# Patient Record
Sex: Female | Born: 1955 | Race: White | Hispanic: No | Marital: Married
Health system: Southern US, Community
[De-identification: ages and names within clinical notes are randomized; demographics above are authoritative.]

## PROBLEM LIST (undated history)

## (undated) DIAGNOSIS — E119 Type 2 diabetes mellitus without complications: Secondary | ICD-10-CM

## (undated) HISTORY — DX: Type 2 diabetes mellitus without complications: E11.9

---

## 1999-01-28 ENCOUNTER — Other Ambulatory Visit: Admission: RE | Admit: 1999-01-28 | Discharge: 1999-01-28 | Payer: Self-pay | Admitting: Gynecology

## 2000-03-21 ENCOUNTER — Other Ambulatory Visit: Admission: RE | Admit: 2000-03-21 | Discharge: 2000-03-21 | Payer: Self-pay | Admitting: *Deleted

## 2000-05-14 ENCOUNTER — Emergency Department (HOSPITAL_COMMUNITY): Admission: EM | Admit: 2000-05-14 | Discharge: 2000-05-14 | Payer: Self-pay | Admitting: Emergency Medicine

## 2000-05-14 ENCOUNTER — Encounter: Payer: Self-pay | Admitting: Emergency Medicine

## 2009-03-26 ENCOUNTER — Other Ambulatory Visit: Admission: RE | Admit: 2009-03-26 | Discharge: 2009-03-26 | Payer: Self-pay | Admitting: Family Medicine

## 2010-04-12 ENCOUNTER — Encounter: Admission: RE | Admit: 2010-04-12 | Discharge: 2010-04-12 | Payer: Self-pay | Admitting: Internal Medicine

## 2010-12-22 ENCOUNTER — Other Ambulatory Visit: Payer: Self-pay | Admitting: Family Medicine

## 2010-12-22 ENCOUNTER — Other Ambulatory Visit (HOSPITAL_COMMUNITY)
Admission: RE | Admit: 2010-12-22 | Discharge: 2010-12-22 | Disposition: A | Discharge status: Home / Self Care | Payer: BC Managed Care – PPO | Source: Ambulatory Visit | Attending: Family Medicine | Admitting: Family Medicine

## 2010-12-22 DIAGNOSIS — Z124 Encounter for screening for malignant neoplasm of cervix: Secondary | ICD-10-CM | POA: Insufficient documentation

## 2010-12-22 DIAGNOSIS — Z1159 Encounter for screening for other viral diseases: Secondary | ICD-10-CM | POA: Insufficient documentation

## 2012-01-02 ENCOUNTER — Other Ambulatory Visit: Payer: Self-pay | Admitting: Family Medicine

## 2012-01-02 DIAGNOSIS — E042 Nontoxic multinodular goiter: Secondary | ICD-10-CM

## 2012-01-05 ENCOUNTER — Ambulatory Visit
Admission: RE | Admit: 2012-01-05 | Discharge: 2012-01-05 | Disposition: A | Discharge status: Home / Self Care | Payer: BC Managed Care – PPO | Source: Ambulatory Visit | Attending: Family Medicine | Admitting: Family Medicine

## 2012-01-05 DIAGNOSIS — E042 Nontoxic multinodular goiter: Secondary | ICD-10-CM

## 2013-11-04 IMAGING — US US SOFT TISSUE HEAD/NECK
1 series · 14 of 25 positions shown · non-contrast
Comparison: Ultrasound of the thyroid of 04/12/2010

CLINICAL DATA: Follow up of thyroid nodule

THYROID ULTRASOUND
TECHNIQUE: Ultrasound examination of the thyroid gland and adjacent
soft tissues was performed.

[Series 1: us soft tissue head/neck · 0.08mm/px · 14 of 39 slices shown]
[im 1/39]
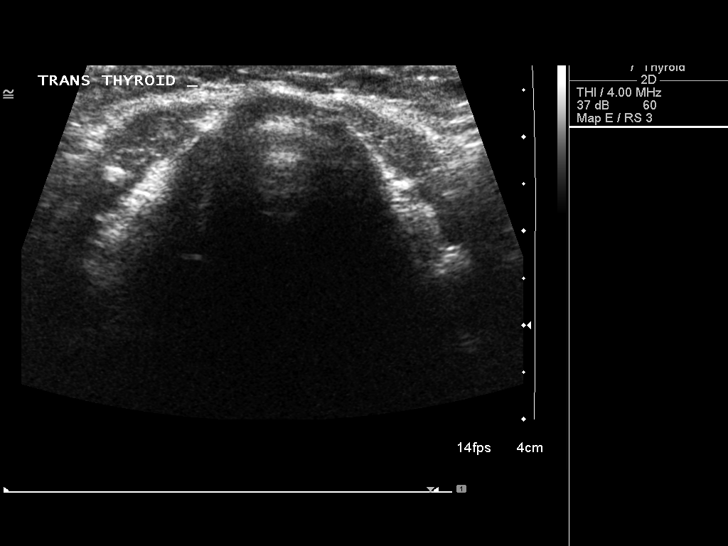
[im 4/39]
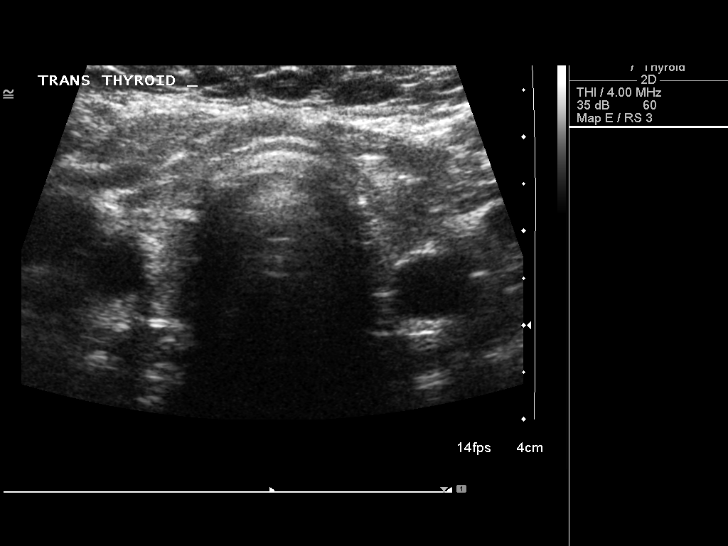
[im 7/39]
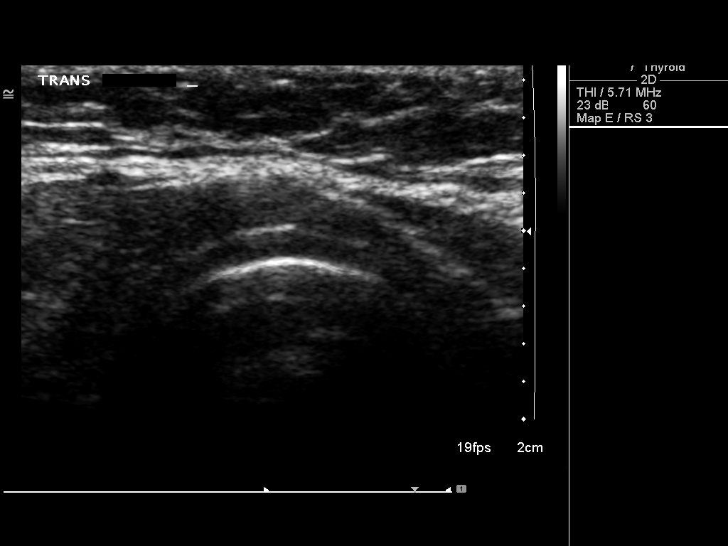
[im 10/39]
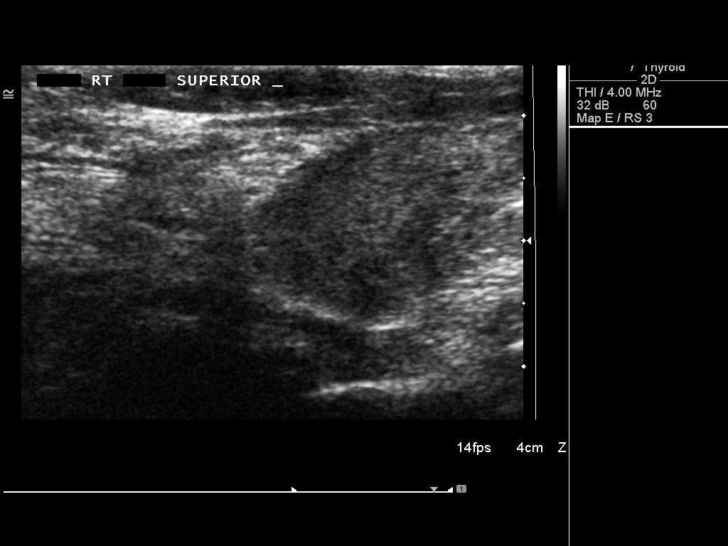
[im 13/39]
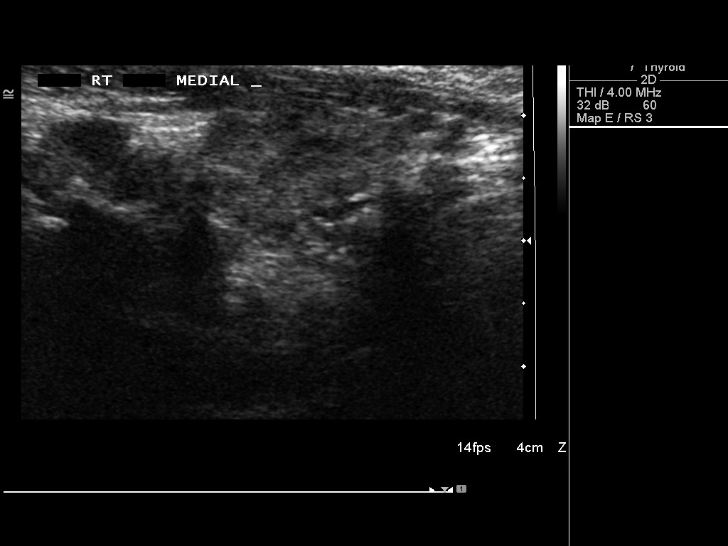
[im 15/39]
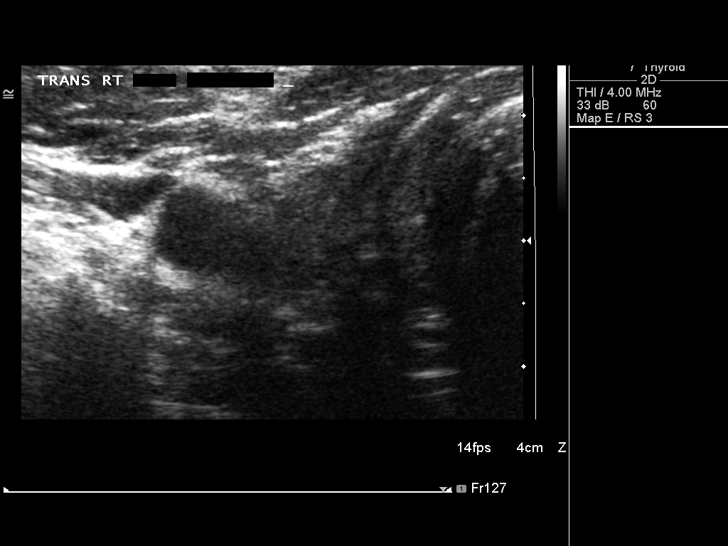
[im 18/39]
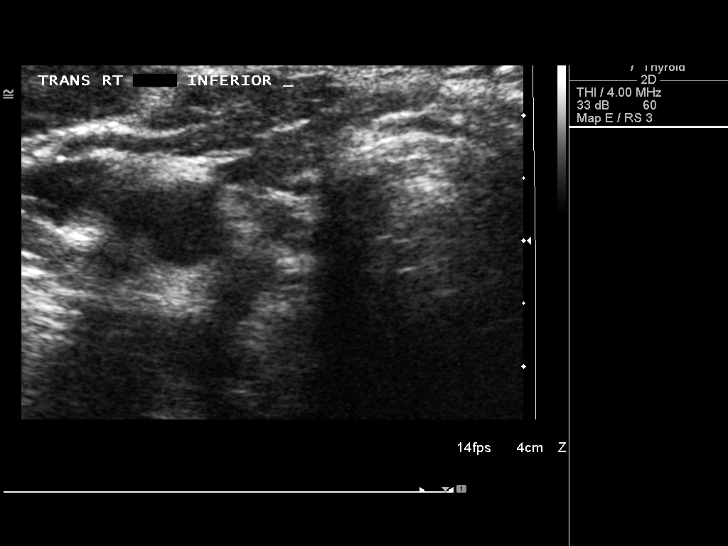
[im 21/39]
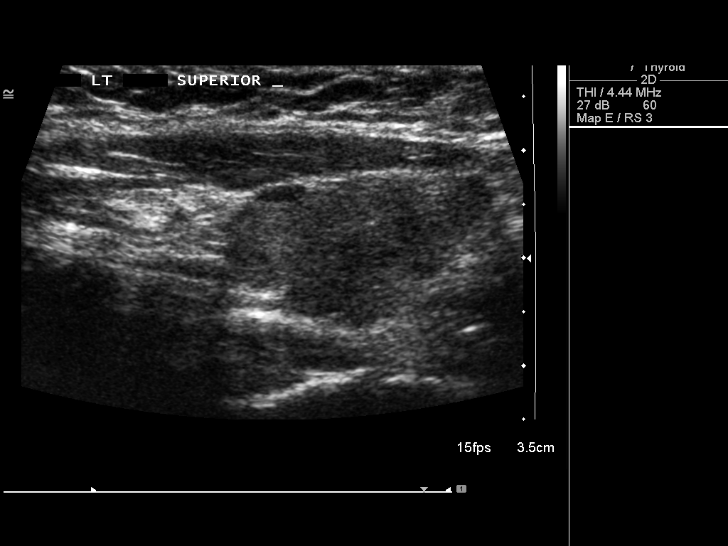
[im 24/39]
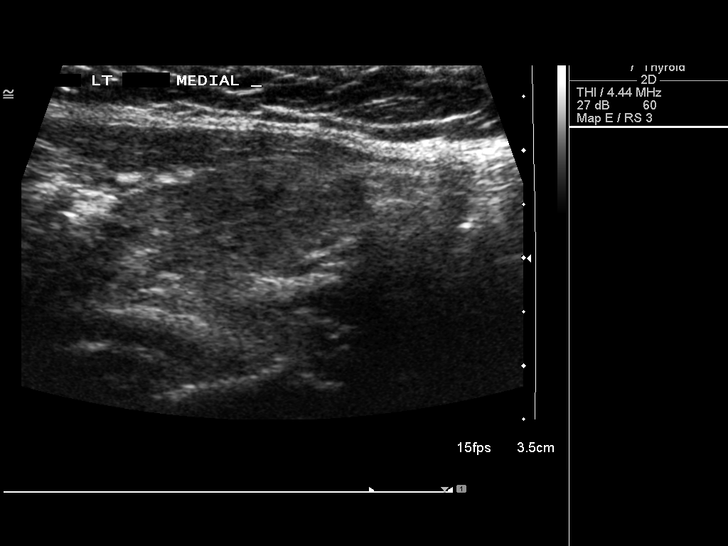
[im 26/39]
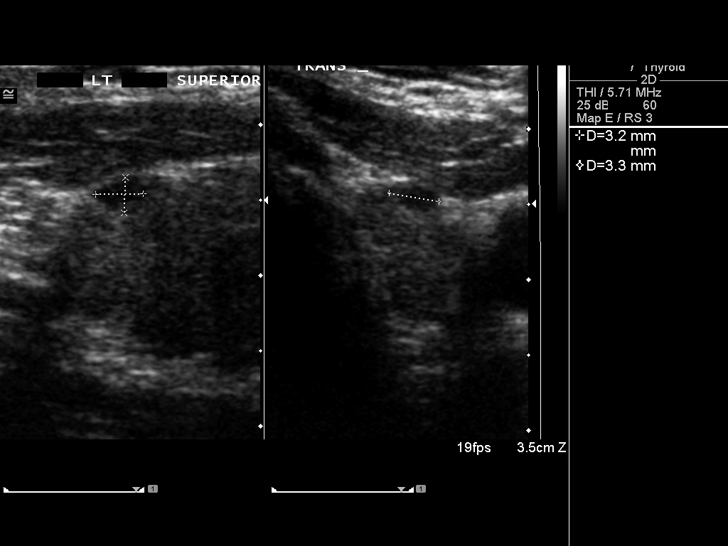
[im 29/39]
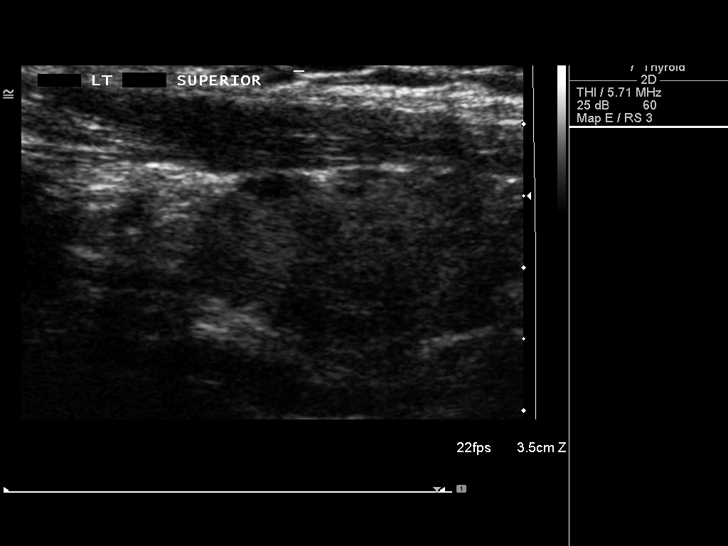
[im 32/39]
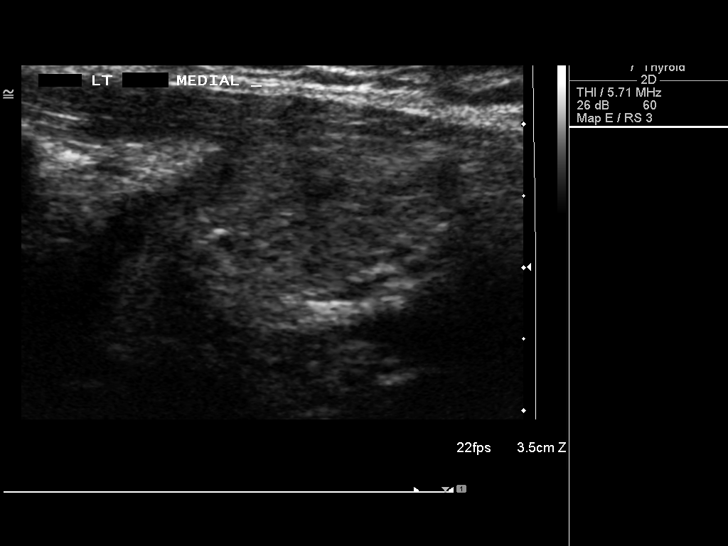
[im 35/39]
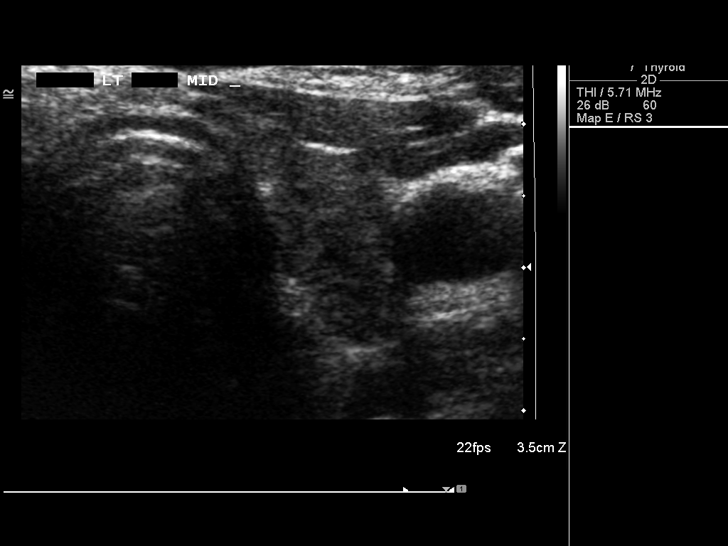
[im 39/39]
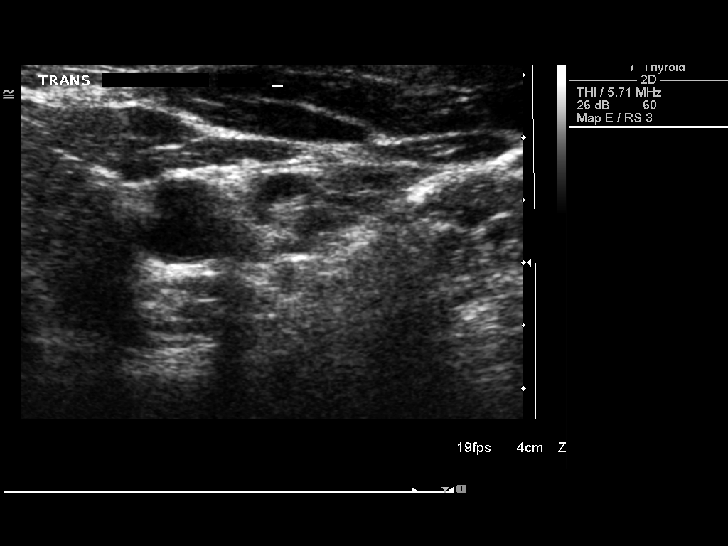

[14 of 25 positions shown; findings below may reference images not displayed]

FINDINGS: Right thyroid lobe:  3.3 x 1.7 x 0.9 cm.  (Previously 3.1 x 1.6 x
0.9 cm).
Left thyroid lobe:  2.9 x 1.5 x 0.9 cm.  (Previously 2.8 x 1.3 x
1.1 cm).
Isthmus:  3 mm in thickness.

Focal nodules:  The gland is inhomogeneous diffusely in
echogenicity.  Only a single tiny hypoechoic nodule is noted in the
upper pole of the left lobe of no more than 3 mm in diameter,
appearing stable.

Lymphadenopathy:  None visualized.
IMPRESSION: Stable ultrasound of the thyroid short small gland which is
diffusely inhomogeneous in echogenicity.

## 2014-01-13 ENCOUNTER — Other Ambulatory Visit: Payer: Self-pay | Admitting: Family Medicine

## 2014-01-13 ENCOUNTER — Other Ambulatory Visit (HOSPITAL_COMMUNITY)
Admission: RE | Admit: 2014-01-13 | Discharge: 2014-01-13 | Disposition: A | Discharge status: Home / Self Care | Payer: BC Managed Care – PPO | Source: Ambulatory Visit | Attending: Family Medicine | Admitting: Family Medicine

## 2014-01-13 DIAGNOSIS — Z124 Encounter for screening for malignant neoplasm of cervix: Secondary | ICD-10-CM | POA: Insufficient documentation

## 2014-01-13 DIAGNOSIS — Z1151 Encounter for screening for human papillomavirus (HPV): Secondary | ICD-10-CM | POA: Insufficient documentation

## 2014-01-14 LAB — CYTOLOGY - PAP

## 2017-08-09 ENCOUNTER — Other Ambulatory Visit (HOSPITAL_COMMUNITY)
Admission: RE | Admit: 2017-08-09 | Discharge: 2017-08-09 | Disposition: A | Discharge status: Home / Self Care | Payer: 59 | Source: Ambulatory Visit | Attending: Family Medicine | Admitting: Family Medicine

## 2017-08-09 ENCOUNTER — Other Ambulatory Visit: Payer: Self-pay | Admitting: Family Medicine

## 2017-08-09 DIAGNOSIS — Z124 Encounter for screening for malignant neoplasm of cervix: Secondary | ICD-10-CM | POA: Diagnosis present

## 2017-08-14 LAB — CYTOLOGY - PAP
Diagnosis: NEGATIVE
HPV: NOT DETECTED

## 2018-07-30 ENCOUNTER — Other Ambulatory Visit: Payer: Self-pay | Admitting: Family Medicine

## 2020-03-18 ENCOUNTER — Encounter: Payer: 59 | Attending: Family Medicine | Admitting: Skilled Nursing Facility1

## 2020-03-18 ENCOUNTER — Other Ambulatory Visit: Payer: Self-pay

## 2020-03-18 ENCOUNTER — Encounter: Payer: Self-pay | Admitting: Skilled Nursing Facility1

## 2020-03-18 DIAGNOSIS — E119 Type 2 diabetes mellitus without complications: Secondary | ICD-10-CM

## 2020-03-18 NOTE — Progress Notes (Signed)
Diabetes Self-Management Education  Visit Type: First/Initial   03/18/2020  [name], identified by name and date of birth, is a 64 y.o. female with a diagnosis of Diabetes: Type 2.   ASSESSMENT  Height 5\' 6"  (1.676 m), weight 188 lb 8 oz (85.5 kg). Body mass index is 30.42 kg/m.  Pt states she is taking this diagnosis very seriously and already lost 10 pounds.  Pt states she rides her peleton daily. Pt states she loves to cook.   Goals: Limit weighing to once a week job, keep up the great work!   Diabetes Self-Management Education - 03/18/20 1457      Visit Information   Visit Type First/Initial      Initial Visit   Diabetes Type Type 2    Are you currently following a meal plan? No    Are you taking your medications as prescribed? Not on Medications      Health Coping   How would you rate your overall health? Good      Psychosocial Assessment   Patient Belief/Attitude about Diabetes Motivated to manage diabetes    Self-care barriers None      Pre-Education Assessment   Patient understands the diabetes disease and treatment process. Needs Instruction    Patient understands incorporating nutritional management into lifestyle. Needs Instruction    Patient undertands incorporating physical activity into lifestyle. Needs Instruction    Patient understands using medications safely. Needs Instruction    Patient understands monitoring blood glucose, interpreting and using results Needs Instruction    Patient understands prevention, detection, and treatment of acute complications. Needs Instruction    Patient understands prevention, detection, and treatment of chronic complications. Needs Instruction    Patient understands how to develop strategies to address psychosocial issues. Needs Instruction    Patient understands how to develop strategies to promote health/change behavior. Needs Instruction      Complications   Last HgB A1C per patient/outside source  6.7 %    How often do you check your blood sugar? 0 times/day (not testing)    Have you had a dilated eye exam in the past 12 months? Yes    Have you had a dental exam in the past 12 months? Yes    Are you checking your feet? No      Dietary Intake   Breakfast 1 egg    Snack (morning) espresso shots half caf + cinnomon + half and half + egg bite fron [employer]    Lunch salad: broccoli, cucmber, tomato, egg    Dinner chicken + broccoli + sweet potato or quesadilla + chicken + onion + jalapano + guacamole    Beverage(s) water,      Exercise   Exercise Type Light (walking / raking leaves)    How many days per week to you exercise? 3    How many minutes per day do you exercise? 20    Total minutes per week of exercise 60      Patient Education   Previous Diabetes Education No      Post-Education Assessment   Patient understands the diabetes disease and treatment process. Demonstrates understanding / competency    Patient understands incorporating nutritional management into lifestyle. Demonstrates understanding / competency    Patient undertands incorporating physical activity into lifestyle. Demonstrates understanding / competency    Patient understands using medications safely. Demonstrates understanding / competency    Patient understands monitoring blood glucose, interpreting and using results Demonstrates understanding / competency  Patient understands prevention, detection, and treatment of acute complications. Demonstrates understanding / competency    Patient understands prevention, detection, and treatment of chronic complications. Demonstrates understanding / competency    Patient understands how to develop strategies to address psychosocial issues. Demonstrates understanding / competency    Patient understands how to develop strategies to promote health/change behavior. Demonstrates understanding / competency      Outcomes   Expected Outcomes Demonstrated interest in  learning. Expect positive outcomes    Future DMSE PRN    Program Status Completed           Individualized Plan for Diabetes Self-Management Training:   Learning Objective:  Patient will have a greater understanding of diabetes self-management. Patient education plan is to attend individual and/or group sessions per assessed needs and concerns.   Plan:   There are no Patient Instructions on file for this visit.  Expected Outcomes:  Demonstrated interest in learning. Expect positive outcomes  Education material provided: ADA - How to Thrive: A Guide for Your Journey with Diabetes and My Plate  If problems or questions, patient to contact team via:  Phone  Future DSME appointment: PRN   Goals: Limit weighing to once a week

## 2021-06-02 ENCOUNTER — Ambulatory Visit
Admission: RE | Admit: 2021-06-02 | Discharge: 2021-06-02 | Disposition: A | Discharge status: Home / Self Care | Payer: 59 | Source: Ambulatory Visit | Attending: Sports Medicine | Admitting: Sports Medicine

## 2021-06-02 ENCOUNTER — Other Ambulatory Visit: Payer: Self-pay

## 2021-06-02 ENCOUNTER — Other Ambulatory Visit: Payer: Self-pay | Admitting: Sports Medicine

## 2021-06-02 DIAGNOSIS — M542 Cervicalgia: Secondary | ICD-10-CM

## 2023-04-02 IMAGING — CR DG CERVICAL SPINE 2 OR 3 VIEWS
3 series · 3 of 3 positions shown · non-contrast
Comparison: None.

CLINICAL DATA: Neck pain, right shoulder pain

EXAM:
CERVICAL SPINE - 2-3 VIEW

[w cervical spine lat]
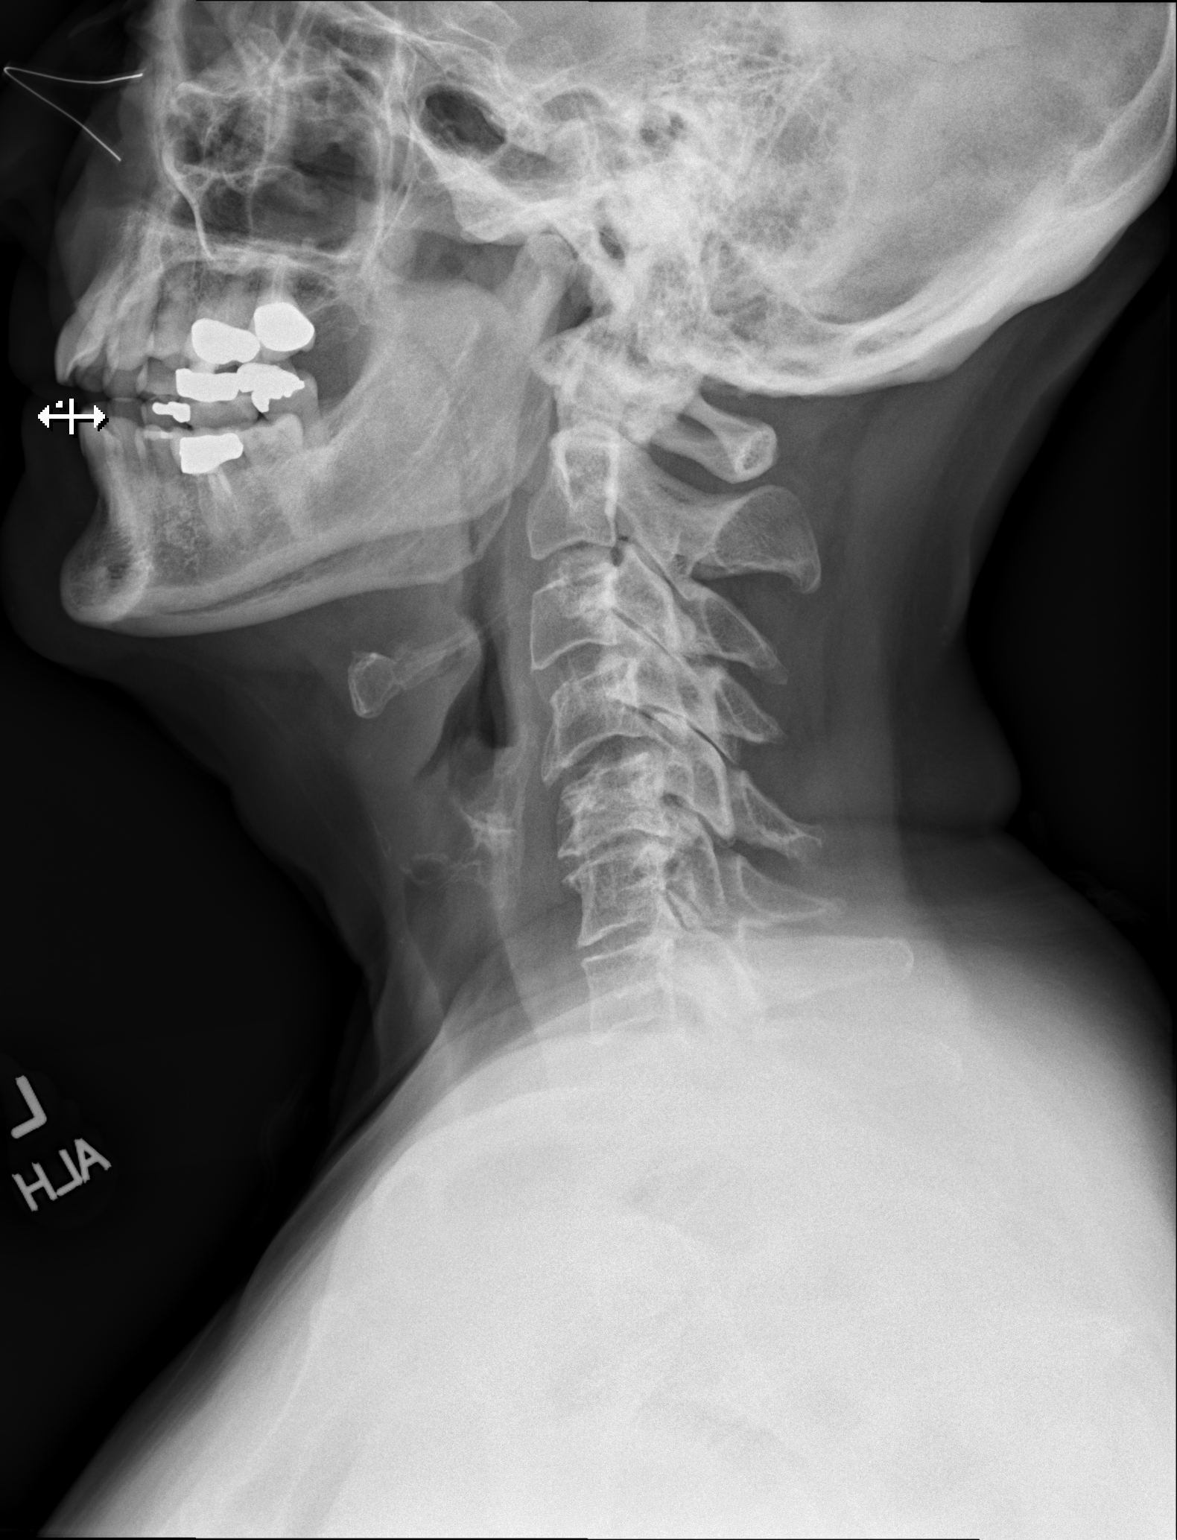

[w cervical spine ap]
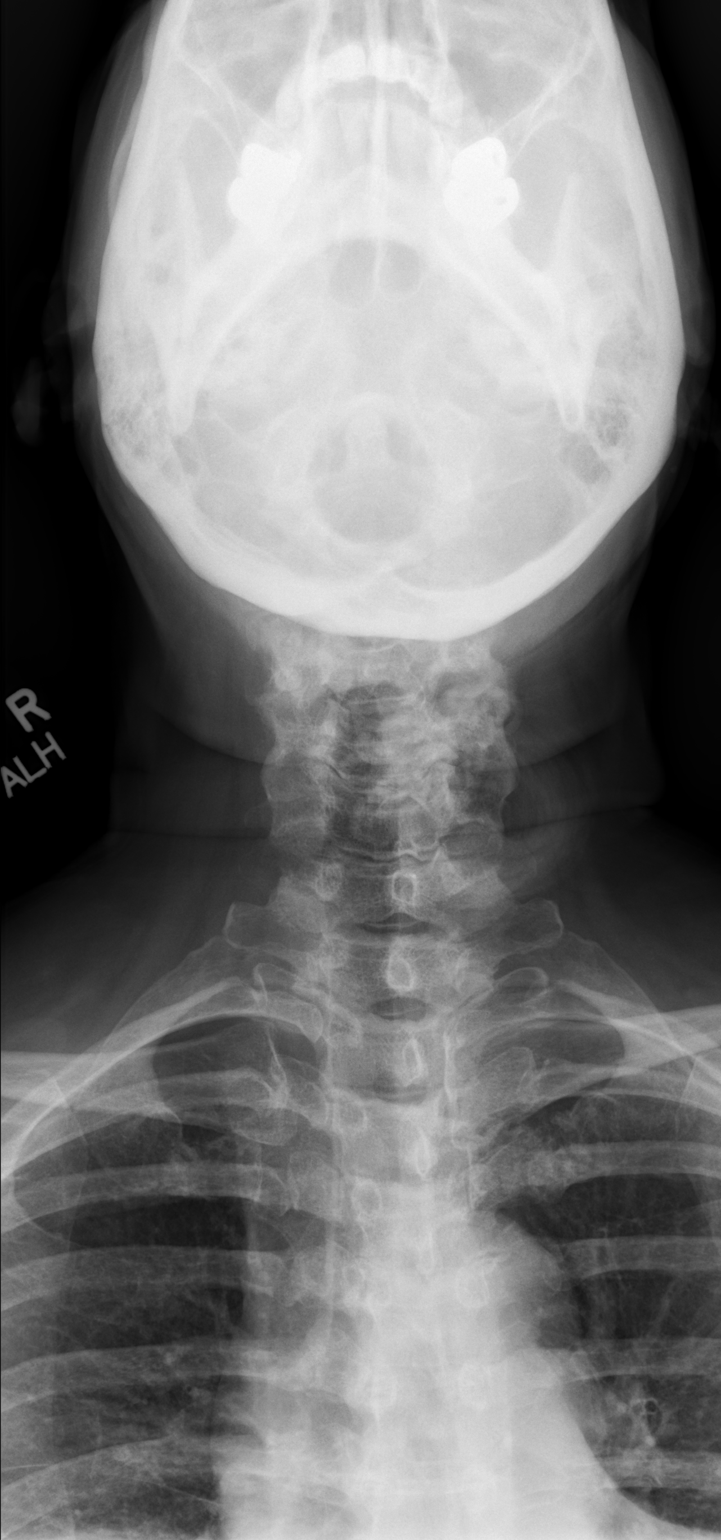

[w cervical spine odontoid]
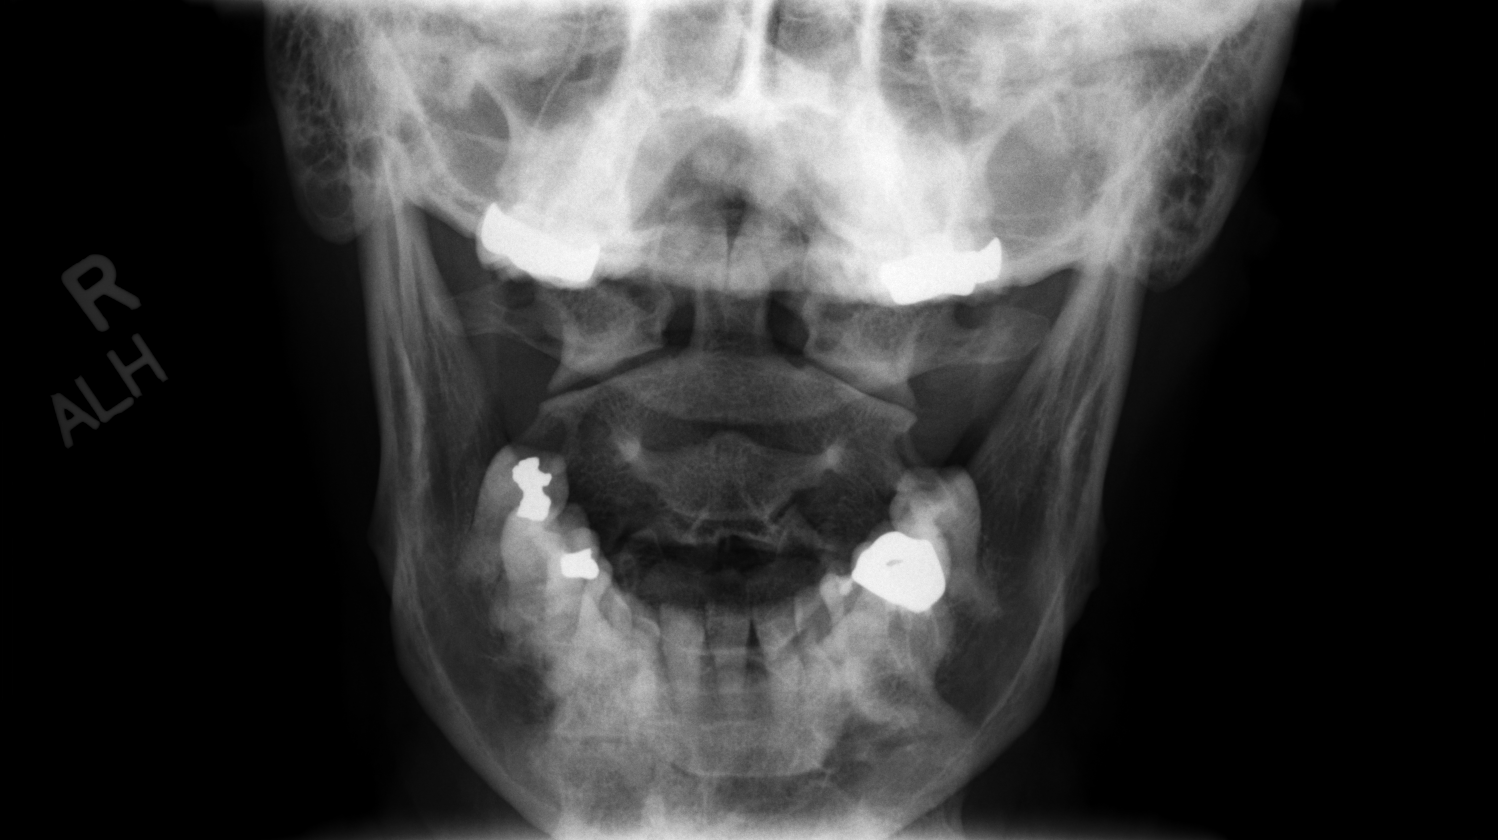

[3 of 3 positions shown; findings below may reference images not displayed]

FINDINGS: No recent fracture is seen. Degenerative changes are noted with bony
spurs, disc space narrowing and facet hypertrophy, more severe at
C5-C6 level. There is minimal anterolisthesis at C3-C4 level. In the
AP view, there is encroachment of neural foramina from C3-C7 levels,
more severe at C5-C6 level. Prevertebral soft tissues are
unremarkable.
IMPRESSION: No recent fracture is seen. Cervical spondylosis with encroachment
of neural foramina from C3-C7 levels, more severe at C5-C6 level.
There is 3 mm anterolisthesis at C3-C4 level, most likely due to
previous ligament injury and facet degeneration.

## 2023-05-15 DIAGNOSIS — R7303 Prediabetes: Secondary | ICD-10-CM | POA: Diagnosis not present

## 2023-05-15 DIAGNOSIS — I10 Essential (primary) hypertension: Secondary | ICD-10-CM | POA: Diagnosis not present

## 2023-05-15 DIAGNOSIS — E039 Hypothyroidism, unspecified: Secondary | ICD-10-CM | POA: Diagnosis not present

## 2023-05-22 DIAGNOSIS — Z0100 Encounter for examination of eyes and vision without abnormal findings: Secondary | ICD-10-CM | POA: Diagnosis not present

## 2023-05-22 DIAGNOSIS — Z23 Encounter for immunization: Secondary | ICD-10-CM | POA: Diagnosis not present

## 2023-05-22 DIAGNOSIS — E7800 Pure hypercholesterolemia, unspecified: Secondary | ICD-10-CM | POA: Diagnosis not present

## 2023-05-22 DIAGNOSIS — I10 Essential (primary) hypertension: Secondary | ICD-10-CM | POA: Diagnosis not present

## 2023-05-22 DIAGNOSIS — F411 Generalized anxiety disorder: Secondary | ICD-10-CM | POA: Diagnosis not present

## 2023-05-22 DIAGNOSIS — R7303 Prediabetes: Secondary | ICD-10-CM | POA: Diagnosis not present

## 2023-05-22 DIAGNOSIS — E039 Hypothyroidism, unspecified: Secondary | ICD-10-CM | POA: Diagnosis not present

## 2023-05-22 DIAGNOSIS — G479 Sleep disorder, unspecified: Secondary | ICD-10-CM | POA: Diagnosis not present

## 2023-05-22 DIAGNOSIS — Z6831 Body mass index (BMI) 31.0-31.9, adult: Secondary | ICD-10-CM | POA: Diagnosis not present

## 2023-11-29 DIAGNOSIS — E2839 Other primary ovarian failure: Secondary | ICD-10-CM | POA: Diagnosis not present

## 2023-11-29 DIAGNOSIS — Z0000 Encounter for general adult medical examination without abnormal findings: Secondary | ICD-10-CM | POA: Diagnosis not present

## 2023-11-29 DIAGNOSIS — E7800 Pure hypercholesterolemia, unspecified: Secondary | ICD-10-CM | POA: Diagnosis not present

## 2023-11-29 DIAGNOSIS — R002 Palpitations: Secondary | ICD-10-CM | POA: Diagnosis not present

## 2023-11-29 DIAGNOSIS — I10 Essential (primary) hypertension: Secondary | ICD-10-CM | POA: Diagnosis not present

## 2023-11-29 DIAGNOSIS — F411 Generalized anxiety disorder: Secondary | ICD-10-CM | POA: Diagnosis not present

## 2023-11-29 DIAGNOSIS — R7303 Prediabetes: Secondary | ICD-10-CM | POA: Diagnosis not present

## 2023-11-29 DIAGNOSIS — Z01419 Encounter for gynecological examination (general) (routine) without abnormal findings: Secondary | ICD-10-CM | POA: Diagnosis not present

## 2023-11-29 DIAGNOSIS — E039 Hypothyroidism, unspecified: Secondary | ICD-10-CM | POA: Diagnosis not present

## 2023-12-30 DIAGNOSIS — F411 Generalized anxiety disorder: Secondary | ICD-10-CM | POA: Diagnosis not present

## 2023-12-30 DIAGNOSIS — E7800 Pure hypercholesterolemia, unspecified: Secondary | ICD-10-CM | POA: Diagnosis not present

## 2023-12-30 DIAGNOSIS — E039 Hypothyroidism, unspecified: Secondary | ICD-10-CM | POA: Diagnosis not present

## 2023-12-30 DIAGNOSIS — I10 Essential (primary) hypertension: Secondary | ICD-10-CM | POA: Diagnosis not present

## 2024-01-29 DIAGNOSIS — E039 Hypothyroidism, unspecified: Secondary | ICD-10-CM | POA: Diagnosis not present

## 2024-01-29 DIAGNOSIS — E7800 Pure hypercholesterolemia, unspecified: Secondary | ICD-10-CM | POA: Diagnosis not present

## 2024-01-29 DIAGNOSIS — I10 Essential (primary) hypertension: Secondary | ICD-10-CM | POA: Diagnosis not present

## 2024-01-29 DIAGNOSIS — F411 Generalized anxiety disorder: Secondary | ICD-10-CM | POA: Diagnosis not present

## 2024-02-29 DIAGNOSIS — I10 Essential (primary) hypertension: Secondary | ICD-10-CM | POA: Diagnosis not present

## 2024-02-29 DIAGNOSIS — E7800 Pure hypercholesterolemia, unspecified: Secondary | ICD-10-CM | POA: Diagnosis not present

## 2024-02-29 DIAGNOSIS — F411 Generalized anxiety disorder: Secondary | ICD-10-CM | POA: Diagnosis not present

## 2024-02-29 DIAGNOSIS — E039 Hypothyroidism, unspecified: Secondary | ICD-10-CM | POA: Diagnosis not present

## 2024-03-31 DIAGNOSIS — I10 Essential (primary) hypertension: Secondary | ICD-10-CM | POA: Diagnosis not present

## 2024-03-31 DIAGNOSIS — F411 Generalized anxiety disorder: Secondary | ICD-10-CM | POA: Diagnosis not present

## 2024-03-31 DIAGNOSIS — E039 Hypothyroidism, unspecified: Secondary | ICD-10-CM | POA: Diagnosis not present

## 2024-03-31 DIAGNOSIS — E7800 Pure hypercholesterolemia, unspecified: Secondary | ICD-10-CM | POA: Diagnosis not present

## 2024-04-29 DIAGNOSIS — Z1231 Encounter for screening mammogram for malignant neoplasm of breast: Secondary | ICD-10-CM | POA: Diagnosis not present

## 2024-04-29 DIAGNOSIS — E2839 Other primary ovarian failure: Secondary | ICD-10-CM | POA: Diagnosis not present

## 2024-04-30 DIAGNOSIS — E7800 Pure hypercholesterolemia, unspecified: Secondary | ICD-10-CM | POA: Diagnosis not present

## 2024-04-30 DIAGNOSIS — E039 Hypothyroidism, unspecified: Secondary | ICD-10-CM | POA: Diagnosis not present

## 2024-04-30 DIAGNOSIS — I10 Essential (primary) hypertension: Secondary | ICD-10-CM | POA: Diagnosis not present

## 2024-04-30 DIAGNOSIS — F411 Generalized anxiety disorder: Secondary | ICD-10-CM | POA: Diagnosis not present

## 2024-05-28 DIAGNOSIS — Z6831 Body mass index (BMI) 31.0-31.9, adult: Secondary | ICD-10-CM | POA: Diagnosis not present

## 2024-05-28 DIAGNOSIS — E7800 Pure hypercholesterolemia, unspecified: Secondary | ICD-10-CM | POA: Diagnosis not present

## 2024-05-28 DIAGNOSIS — E039 Hypothyroidism, unspecified: Secondary | ICD-10-CM | POA: Diagnosis not present

## 2024-05-28 DIAGNOSIS — I10 Essential (primary) hypertension: Secondary | ICD-10-CM | POA: Diagnosis not present

## 2024-05-28 DIAGNOSIS — Z23 Encounter for immunization: Secondary | ICD-10-CM | POA: Diagnosis not present

## 2024-05-28 DIAGNOSIS — E1136 Type 2 diabetes mellitus with diabetic cataract: Secondary | ICD-10-CM | POA: Diagnosis not present

## 2024-05-28 DIAGNOSIS — F411 Generalized anxiety disorder: Secondary | ICD-10-CM | POA: Diagnosis not present

## 2024-05-31 DIAGNOSIS — E7800 Pure hypercholesterolemia, unspecified: Secondary | ICD-10-CM | POA: Diagnosis not present

## 2024-05-31 DIAGNOSIS — F411 Generalized anxiety disorder: Secondary | ICD-10-CM | POA: Diagnosis not present

## 2024-05-31 DIAGNOSIS — E039 Hypothyroidism, unspecified: Secondary | ICD-10-CM | POA: Diagnosis not present

## 2024-05-31 DIAGNOSIS — I10 Essential (primary) hypertension: Secondary | ICD-10-CM | POA: Diagnosis not present

## 2024-06-24 DIAGNOSIS — E1169 Type 2 diabetes mellitus with other specified complication: Secondary | ICD-10-CM | POA: Diagnosis not present

## 2024-06-24 DIAGNOSIS — I10 Essential (primary) hypertension: Secondary | ICD-10-CM | POA: Diagnosis not present

## 2024-06-24 DIAGNOSIS — E7800 Pure hypercholesterolemia, unspecified: Secondary | ICD-10-CM | POA: Diagnosis not present

## 2024-06-24 DIAGNOSIS — E039 Hypothyroidism, unspecified: Secondary | ICD-10-CM | POA: Diagnosis not present

## 2024-09-05 NOTE — Progress Notes (Signed)
 Sheila Kane                                          MRN: [id number]   09/05/2024   The [facility] Quality Team Specialist reviewed this patient medical record for the purposes of chart review for care gap closure. The following were reviewed: chart review for care gap closure-diabetic eye exam.    [facility] Quality Team
